# Patient Record
Sex: Female | Born: 1975 | Hispanic: No | Smoking: Never smoker
Health system: Southern US, Community
[De-identification: ages and names within clinical notes are randomized; demographics above are authoritative.]

---

## 2021-10-28 ENCOUNTER — Emergency Department
Admission: EM | Admit: 2021-10-28 | Discharge: 2021-10-28 | Disposition: A | Discharge status: Home / Self Care | Payer: Self-pay | Attending: Emergency Medicine | Admitting: Emergency Medicine

## 2021-10-28 ENCOUNTER — Other Ambulatory Visit: Payer: Self-pay

## 2021-10-28 ENCOUNTER — Encounter: Payer: Self-pay | Admitting: *Deleted

## 2021-10-28 ENCOUNTER — Emergency Department: Payer: Self-pay

## 2021-10-28 DIAGNOSIS — R109 Unspecified abdominal pain: Secondary | ICD-10-CM | POA: Insufficient documentation

## 2021-10-28 DIAGNOSIS — N23 Unspecified renal colic: Secondary | ICD-10-CM

## 2021-10-28 DIAGNOSIS — R8289 Other abnormal findings on cytological and histological examination of urine: Secondary | ICD-10-CM | POA: Insufficient documentation

## 2021-10-28 DIAGNOSIS — D72829 Elevated white blood cell count, unspecified: Secondary | ICD-10-CM | POA: Insufficient documentation

## 2021-10-28 DIAGNOSIS — G8929 Other chronic pain: Secondary | ICD-10-CM | POA: Insufficient documentation

## 2021-10-28 LAB — COMPREHENSIVE METABOLIC PANEL
ALT: 20 U/L (ref 0–44)
AST: 17 U/L (ref 15–41)
Albumin: 4.2 g/dL (ref 3.5–5.0)
Alkaline Phosphatase: 56 U/L (ref 38–126)
Anion gap: 8 (ref 5–15)
BUN: 27 mg/dL — ABNORMAL HIGH (ref 6–20)
CO2: 29 mmol/L (ref 22–32)
Calcium: 9.3 mg/dL (ref 8.9–10.3)
Chloride: 103 mmol/L (ref 98–111)
Creatinine, Ser: 0.74 mg/dL (ref 0.44–1.00)
GFR, Estimated: >60 mL/min (ref >60)
Glucose, Bld: 140 mg/dL — ABNORMAL HIGH (ref 70–99)
Potassium: 4.1 mmol/L (ref 3.5–5.1)
Sodium: 140 mmol/L (ref 135–145)
Total Bilirubin: 0.5 mg/dL (ref 0.3–1.2)
Total Protein: 7.4 g/dL (ref 6.5–8.1)

## 2021-10-28 LAB — URINALYSIS, ROUTINE W REFLEX MICROSCOPIC
Bacteria, UA: NONE SEEN
Bilirubin Urine: NEGATIVE
Glucose, UA: NEGATIVE mg/dL
Ketones, ur: NEGATIVE mg/dL
Leukocytes,Ua: NEGATIVE
Nitrite: NEGATIVE
Protein, ur: NEGATIVE mg/dL
Specific Gravity, Urine: 1.027 (ref 1.005–1.030)
Squamous Epithelial / HPF: NONE SEEN (ref 0–5)
pH: 5 (ref 5.0–8.0)

## 2021-10-28 LAB — CBC
HCT: 40.3 % (ref 36.0–46.0)
Hemoglobin: 13.2 g/dL (ref 12.0–15.0)
MCH: 29.8 pg (ref 26.0–34.0)
MCHC: 32.8 g/dL (ref 30.0–36.0)
MCV: 91 fL (ref 80.0–100.0)
Platelets: 304 10*3/uL (ref 150–400)
RBC: 4.43 MIL/uL (ref 3.87–5.11)
RDW: 12.3 % (ref 11.5–15.5)
WBC: 13.9 10*3/uL — ABNORMAL HIGH (ref 4.0–10.5)
nRBC: 0 % (ref 0.0–0.2)

## 2021-10-28 LAB — PREGNANCY, URINE: Preg Test, Ur: NEGATIVE

## 2021-10-28 MED ORDER — LACTATED RINGERS IV BOLUS
1000.0000 mL | Freq: Once | INTRAVENOUS | Status: AC
  Administered 2021-10-28: 1000 mL via INTRAVENOUS

## 2021-10-28 MED ORDER — KETOROLAC TROMETHAMINE 30 MG/ML IJ SOLN
15.0000 mg | Freq: Once | INTRAMUSCULAR | Status: AC
  Administered 2021-10-28: 15 mg via INTRAVENOUS
  Filled 2021-10-28: qty 1

## 2021-10-28 NOTE — ED Provider Notes (Signed)
[facility] Provider Note    Event Date/Time   First MD Initiated Contact with Patient 10/28/21 2055     (approximate)   History   Flank Pain   HPI  Carmen Bowman is a 46 y.o. female who presents to the ED for evaluation of Flank Pain   Patient presents to the ED for evaluation of acute on chronic intermittent right-sided flank pain.  She reports a history of kidney stones and deals with a degree of mild-moderate pain fairly chronically, but over the past 2 days has had acutely worsening pain for which she presents to the ED.  Denies any fevers, dysuria, emesis, diarrhea.  Has not had any stones requiring procedures in the past.  Always passed on their own.  Has had cesarean section, no other intra-abdominal surgeries.   Physical Exam   Triage Vital Signs: ED Triage Vitals  Enc Vitals Group     BP --      Pulse --      Resp --      Temp --      Temp src --      SpO2 --      Weight 10/28/21 2017 132 lb 4.4 oz (60 kg)     Height 10/28/21 2017 5\' 4"  (1.626 m)     Head Circumference --      Peak Flow --      Pain Score 10/28/21 2016 8     Pain Loc --      Pain Edu? --      Excl. in GC? --     Most recent vital signs: There were no vitals filed for this visit.  General: Awake, no distress.  CV:  Good peripheral perfusion.  Resp:  Normal effort.  Abd:  No distention.  Mild and poorly localizing right-sided abdominal tenderness to palpation.  Right-sided CVA tenderness is mild as well.  No peritoneal features.  Left-sided abdomen is benign. MSK:  No deformity noted.  Neuro:  No focal deficits appreciated. Other:     ED Results / Procedures / Treatments   Labs (all labs ordered are listed, but only abnormal results are displayed) Labs Reviewed  COMPREHENSIVE METABOLIC PANEL - Abnormal; Notable for the following components:      Result Value   Glucose, Bld 140 (*)    BUN 27 (*)    All other components within normal limits  CBC  - Abnormal; Notable for the following components:   WBC 13.9 (*)    All other components within normal limits  URINALYSIS, ROUTINE W REFLEX MICROSCOPIC - Abnormal; Notable for the following components:   Color, Urine YELLOW (*)    APPearance CLEAR (*)    Hgb urine dipstick SMALL (*)    All other components within normal limits    EKG Sinus rhythm with a rate of 67 bpm.  Normal axis and intervals.  No evidence of acute ischemia.  RADIOLOGY CT renal study interpreted by me with some intrarenal stones but no ureteral stones or obstruction  Official radiology report(s): No results found.  PROCEDURES and INTERVENTIONS:  Procedures  Medications - No data to display   IMPRESSION / MDM / ASSESSMENT AND PLAN / ED COURSE  I reviewed the triage vital signs and the nursing notes.  Differential diagnosis includes, but is not limited to, ureterolithiasis, nephrolithiasis, acute cystitis, hypokalemia  {Patient presents with symptoms of an acute illness or injury that is potentially life-threatening.  46 year old female presents to  the ED with right-sided flank and abdominal pain, possibly due to ureteral colic and recently passed stone, ultimately suitable for outpatient management.  Has some mild right-sided tenderness but no peritoneal features.  Blood work is reassuring with normal metabolic panel.  CBC with mild nonspecific leukocytosis.  Urine without infectious features and has some small blood, possibly from a recently passed stone.  CT without any ureteral stones.  Pain resolved with fluids and Toradol.  No barriers to outpatient management.  We discussed return precautions.  Clinical Course as of 10/28/21 2234  Wed Oct 28, 2021  2231 Reassessed.  Patient reports feeling much better.  We discussed possible etiologies of her symptoms. [DS]    Clinical Course User Index [DS] Delton Prairie, MD     FINAL CLINICAL IMPRESSION(S) / ED DIAGNOSES   Final diagnoses:  None     Rx / DC  Orders   ED Discharge Orders     None        Note:  This document was prepared using Dragon voice recognition software and may include unintentional dictation errors.   Delton Prairie, MD 10/28/21 [phone]

## 2021-10-28 NOTE — ED Notes (Signed)
Pt given warm blanket.

## 2021-10-28 NOTE — ED Triage Notes (Signed)
Pt has right flank pain  pt reports nausea. Pt states pain for a long time but worse tonight.  Hx kidney stones.  Pt reports discomfort when urinating.  Pt alert  interpreter on a stick used in triage.

## 2021-10-28 NOTE — ED Notes (Signed)
Pt taken to CT.

## 2021-10-28 NOTE — Discharge Instructions (Signed)
Please take Tylenol and ibuprofen/Advil for your pain.  It is safe to take them together, or to alternate them every few hours.  Take up to 1000mg of Tylenol at a time, up to 4 times per day.  Do not take more than 4000 mg of Tylenol in 24 hours.  For ibuprofen, take 400-600 mg, 3 - 4 times per day.  

## 2023-07-01 IMAGING — CT CT RENAL STONE PROTOCOL
2 of 4 series · 16 of 46 positions shown, 18 images · non-contrast
Comparison: None Available.

CLINICAL DATA: History of ureteral stones, right-sided pain.
Evaluate stone. Pt has right flank pain pt reports nausea. Pt states
pain for a long time but worse tonight. Hx kidney stones. Pt reports
discomfort when urinating.



[Series 2: stone full standard · axial · 0.67mm/px · z∈[-1044,-648]mm · 13 of 87 slices shown, 15 images]
[im 4/87  soft-tissue]
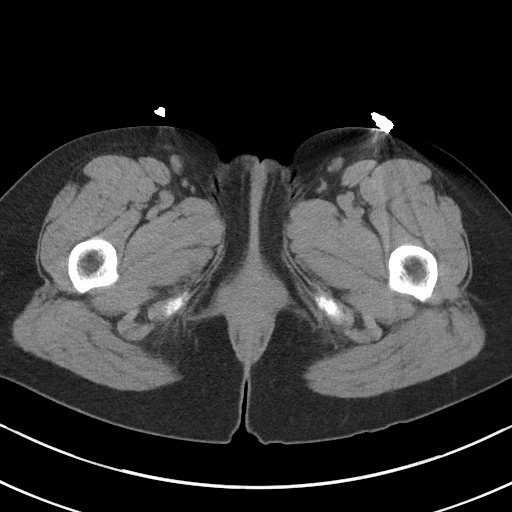
[im 4/87  bone]
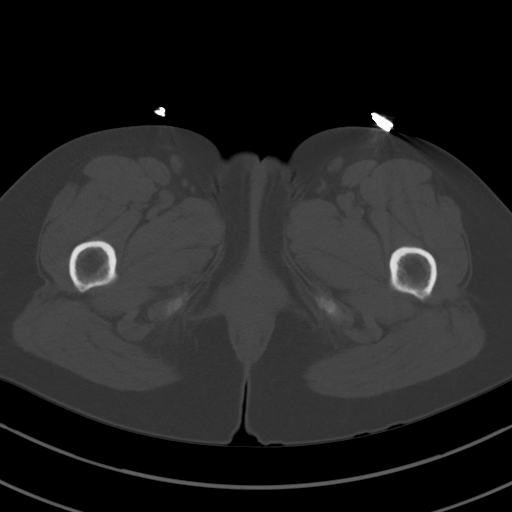
[im 11/87  soft-tissue]
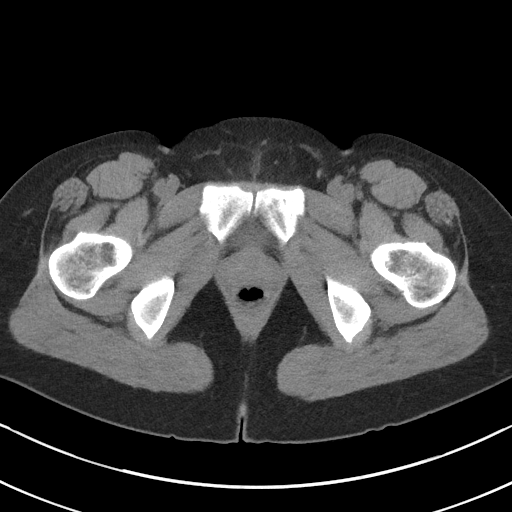
[im 18/87  soft-tissue]
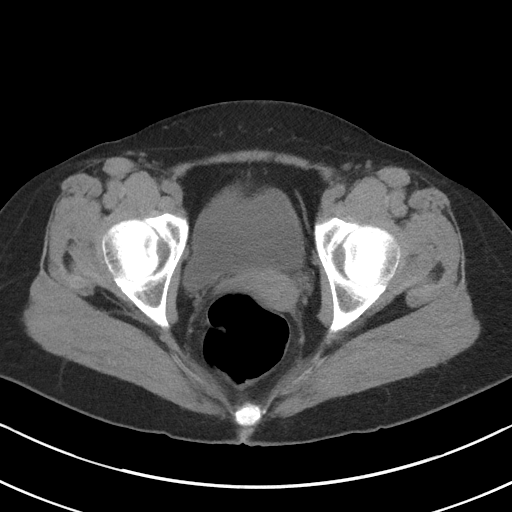
[im 26/87  soft-tissue]
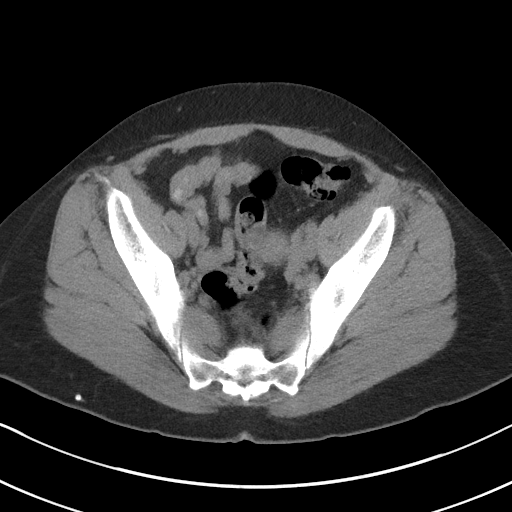
[im 29/87  soft-tissue]
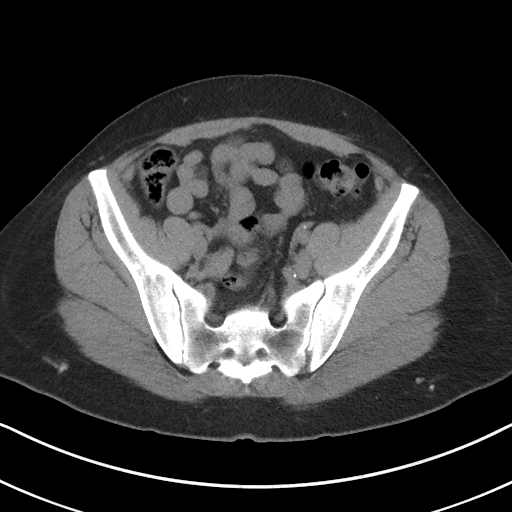
[im 36/87  soft-tissue]
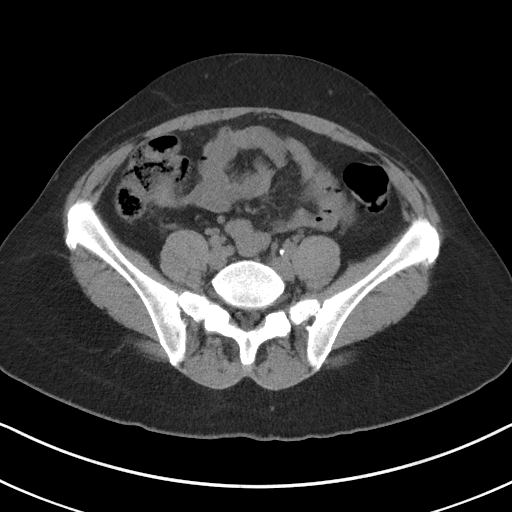
[im 44/87  soft-tissue]
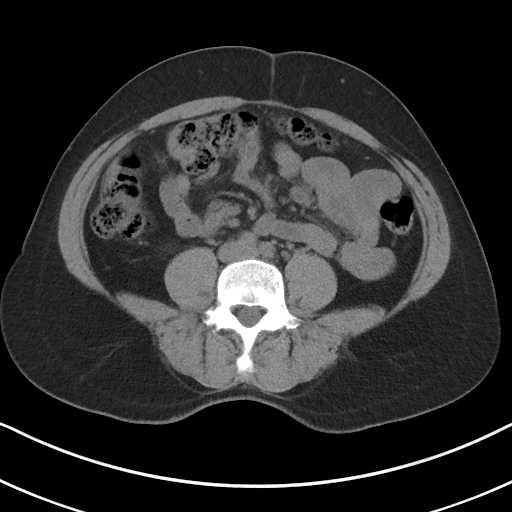
[im 51/87  soft-tissue]
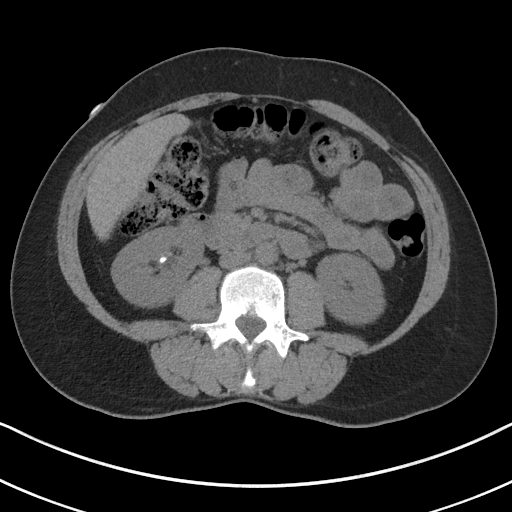
[im 58/87  soft-tissue]
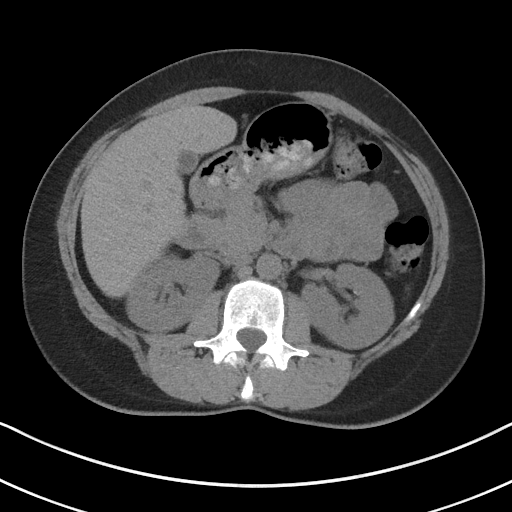
[im 58/87  bone]
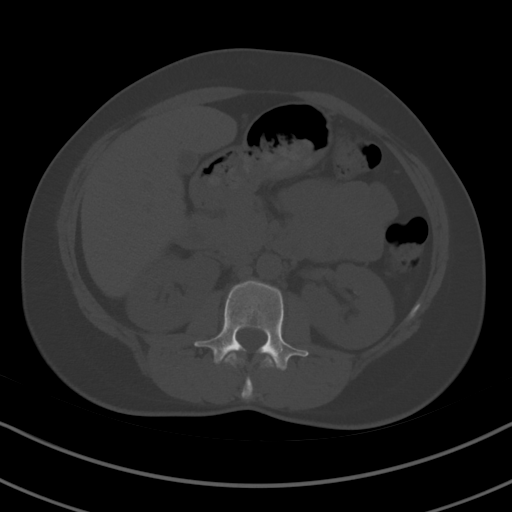
[im 61/87  soft-tissue]
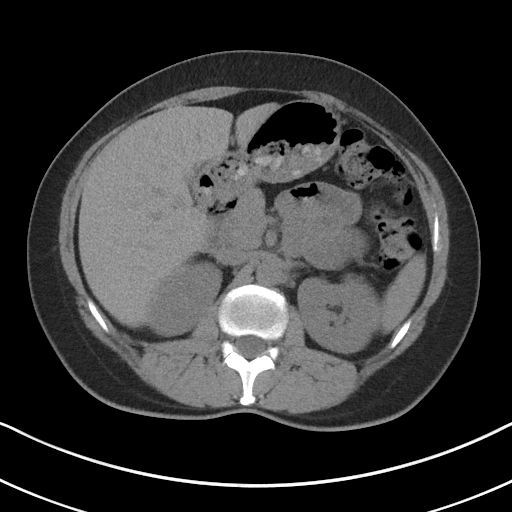
[im 69/87  soft-tissue]
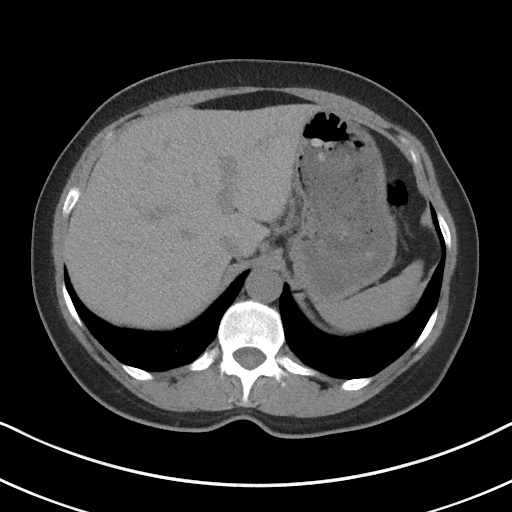
[im 76/87  soft-tissue]
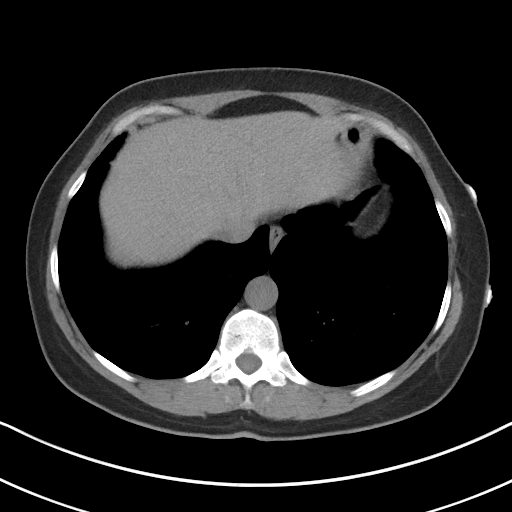
[im 83/87  soft-tissue]
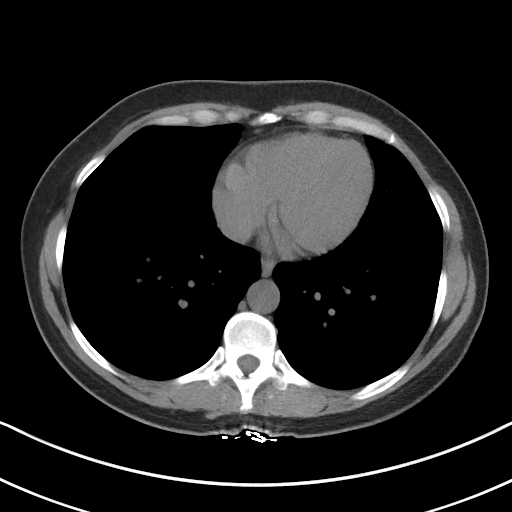

[Series 5: coronal · coronal · 0.82mm/px · 3 of 121 slices shown]
[im 41/121  soft-tissue]
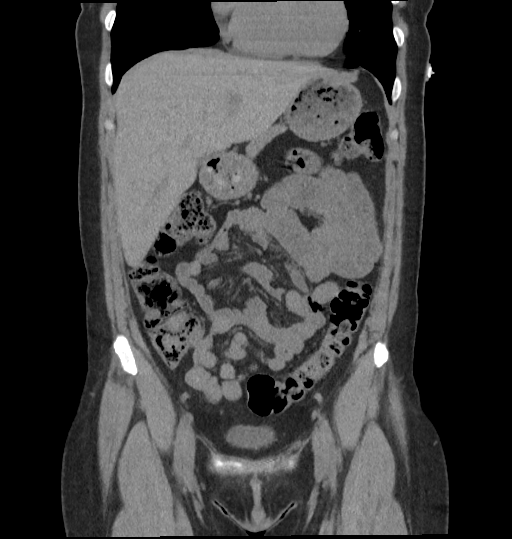
[im 54/121  soft-tissue]
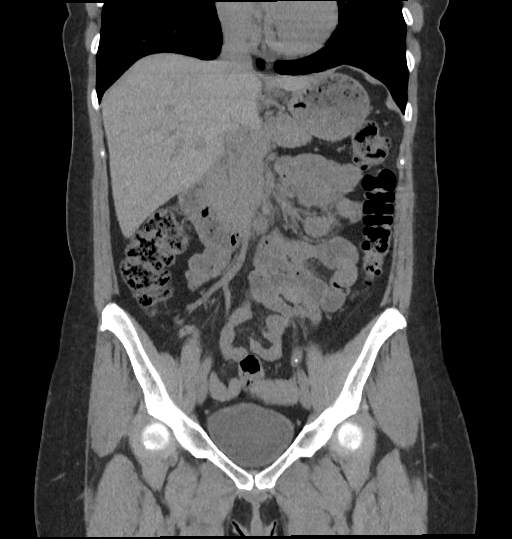
[im 67/121  soft-tissue]
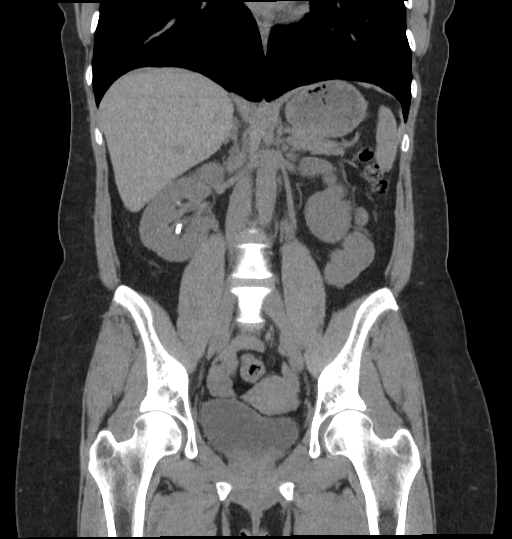

[16 of 46 positions shown; findings below may reference images not displayed]

FINDINGS: Lower chest: No acute abnormality.

Hepatobiliary: No focal liver abnormality. Gallbladder is
contracted. No gallstones, gallbladder wall thickening, or
pericholecystic fluid. No biliary dilatation.

Pancreas: No focal lesion. Normal pancreatic contour. No surrounding
inflammatory changes. No main pancreatic ductal dilatation.

Spleen: Normal in size without focal abnormality.

Adrenals/Urinary Tract:

No adrenal nodule bilaterally.

Bilateral nephrolithiasis measuring up to 6 mm on the right and
punctate on the left. No hydronephrosis. No definite
contour-deforming renal mass.

No ureterolithiasis or hydroureter.

The urinary bladder is unremarkable.

Stomach/Bowel: Stomach is within normal limits. No evidence of bowel
wall thickening or dilatation. Appendix appears normal.

Vascular/Lymphatic: No abdominal aorta or iliac aneurysm. Mild
atherosclerotic plaque of the aorta and its branches. No abdominal,
pelvic, or inguinal lymphadenopathy.

Reproductive: Slightly lobulated anterior uterine wall with query
underlying 1.4 cm lesion. Otherwise uterus and bilateral adnexa are
unremarkable.

Other: No intraperitoneal free fluid. No intraperitoneal free gas.
No organized fluid collection.

Musculoskeletal:

No abdominal wall hernia or abnormality.

No suspicious lytic or blastic osseous lesions. No acute displaced
fracture.
IMPRESSION: 1. Nonobstructive bilateral nephrolithiasis measuring up to 6 mm on
the right and punctate on the left.
2. Uterine fibroid.
3.  Aortic Atherosclerosis ([id number]).
